# Patient Record
Sex: Male | Born: 1945 | Race: Black or African American | Hispanic: Refuse to answer | Marital: Married
Health system: Southern US, Community
[De-identification: ages and names within clinical notes are randomized; demographics above are authoritative.]

---

## 2001-06-05 ENCOUNTER — Encounter: Payer: Self-pay | Admitting: Family Medicine

## 2001-06-05 ENCOUNTER — Encounter: Admission: RE | Admit: 2001-06-05 | Discharge: 2001-06-05 | Payer: Self-pay | Admitting: Family Medicine

## 2008-05-18 ENCOUNTER — Inpatient Hospital Stay (HOSPITAL_COMMUNITY): Admission: RE | Admit: 2008-05-18 | Discharge: 2008-05-21 | Payer: Self-pay | Admitting: Orthopaedic Surgery

## 2009-09-16 IMAGING — CR DG CHEST 2V
2 series · 2 of 2 positions shown · non-contrast
Comparison: None available

CLINICAL DATA: Right knee osteoarthritis.  Preop for total knee
arthroplasty.

CHEST - 2 VIEW

[view not recorded (1 of 2)]
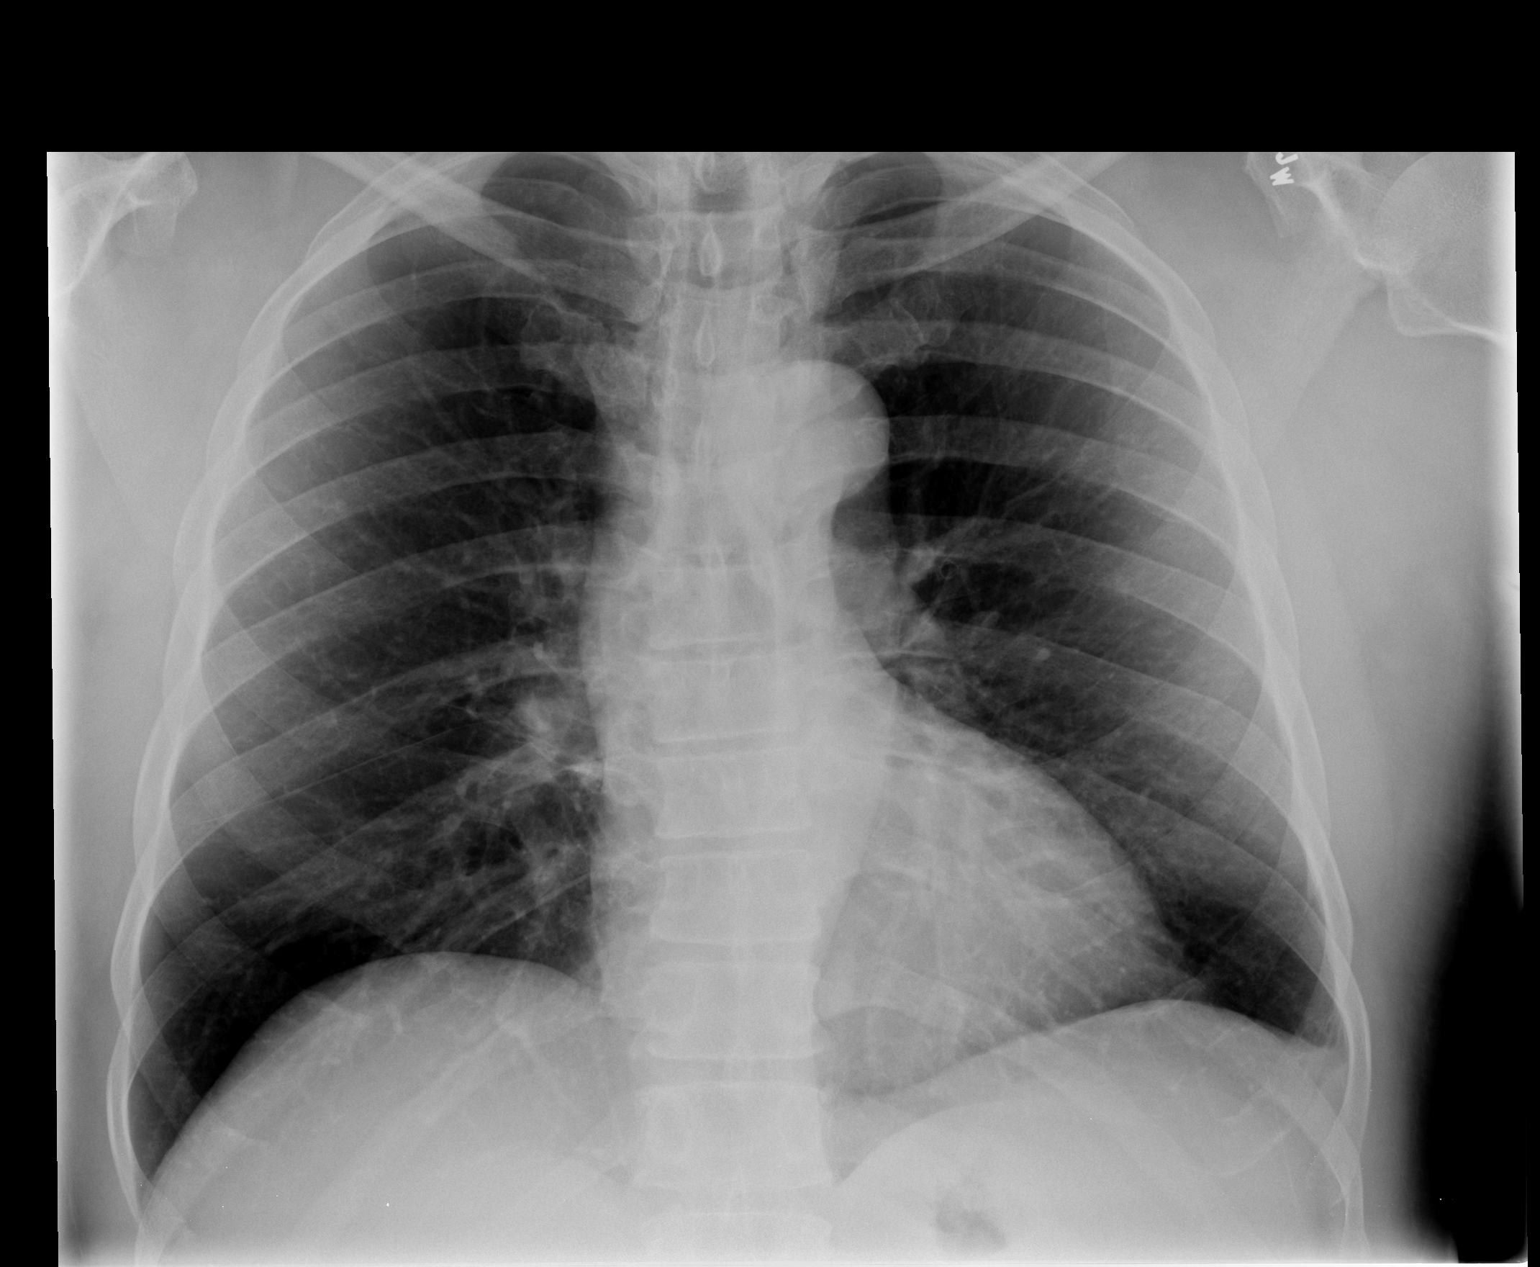

[view not recorded (2 of 2)]
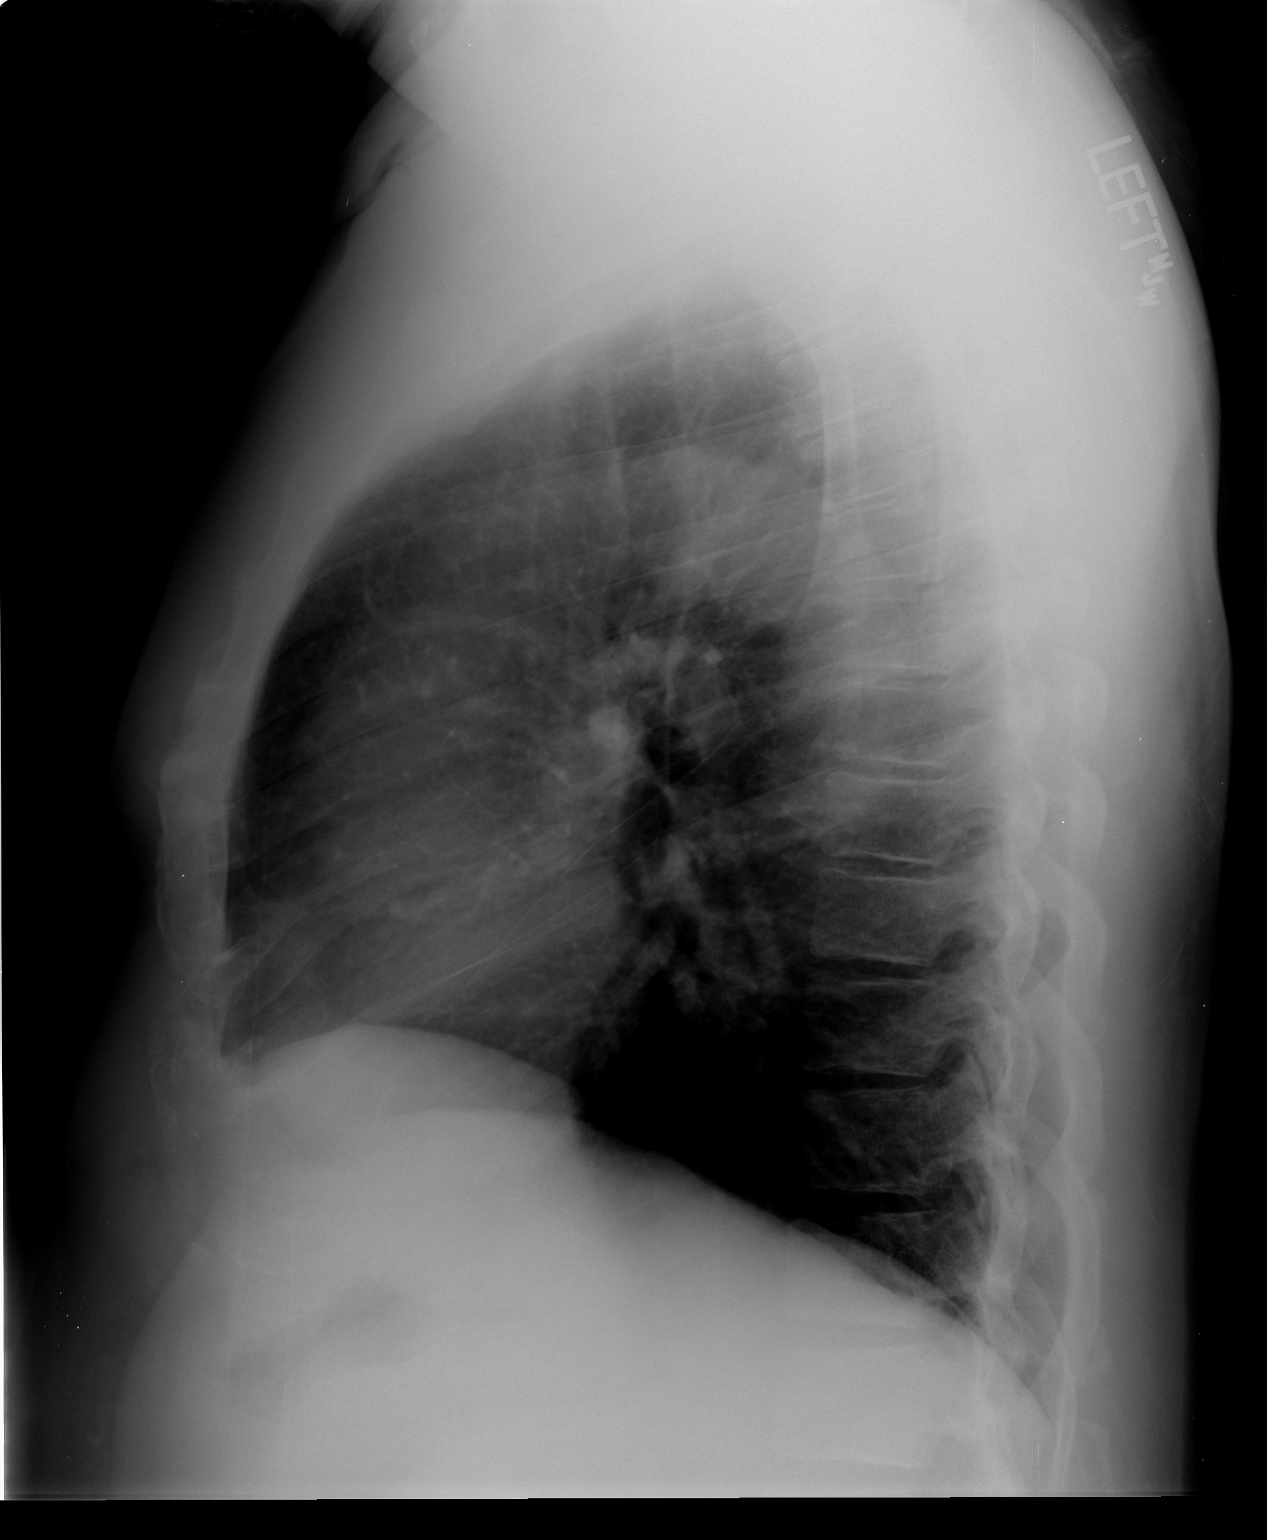

[2 of 2 positions shown; findings below may reference images not displayed]

FINDINGS: Heart size is within normal limits.  Both lungs are
clear.  Mild ectasia of the thoracic aorta is noted.  There is no
evidence of pleural effusion.  No mass or adenopathy identified.
IMPRESSION: No active cardiopulmonary disease.

## 2010-07-12 ENCOUNTER — Encounter: Payer: Self-pay | Admitting: *Deleted

## 2010-08-14 LAB — BASIC METABOLIC PANEL
BUN: 12 mg/dL (ref 6–23)
BUN: 10 mg/dL (ref 6–23)
CO2: 28 mEq/L (ref 19–32)
CO2: 26 mEq/L (ref 19–32)
CO2: 30 mEq/L (ref 19–32)
CO2: 30 mEq/L (ref 19–32)
Calcium: 10 mg/dL (ref 8.4–10.5)
Calcium: 8.7 mg/dL (ref 8.4–10.5)
Chloride: 106 mEq/L (ref 96–112)
Chloride: 95 mEq/L — ABNORMAL LOW (ref 96–112)
Chloride: 97 mEq/L (ref 96–112)
Chloride: 99 mEq/L (ref 96–112)
Creatinine, Ser: 0.87 mg/dL (ref 0.4–1.5)
Creatinine, Ser: 0.97 mg/dL (ref 0.4–1.5)
GFR calc Af Amer: >60 mL/min (ref >60)
GFR calc Af Amer: >60 mL/min (ref >60)
GFR calc Af Amer: >60 mL/min (ref >60)
GFR calc non Af Amer: >60 mL/min (ref >60)
GFR calc non Af Amer: >60 mL/min (ref >60)
GFR calc non Af Amer: >60 mL/min (ref >60)
Glucose, Bld: 79 mg/dL (ref 70–99)
Glucose, Bld: 134 mg/dL — ABNORMAL HIGH (ref 70–99)
Glucose, Bld: 139 mg/dL — ABNORMAL HIGH (ref 70–99)
Potassium: 4.4 mEq/L (ref 3.5–5.1)
Potassium: 3.2 mEq/L — ABNORMAL LOW (ref 3.5–5.1)
Potassium: 3.5 mEq/L (ref 3.5–5.1)
Potassium: 3.6 mEq/L (ref 3.5–5.1)
Sodium: 142 mEq/L (ref 135–145)
Sodium: 134 mEq/L — ABNORMAL LOW (ref 135–145)
Sodium: 135 mEq/L (ref 135–145)
Sodium: 136 mEq/L (ref 135–145)

## 2010-08-14 LAB — CBC
HCT: 46.7 % (ref 39.0–52.0)
HCT: 38.3 % — ABNORMAL LOW (ref 39.0–52.0)
HCT: 38.4 % — ABNORMAL LOW (ref 39.0–52.0)
HCT: 38.5 % — ABNORMAL LOW (ref 39.0–52.0)
Hemoglobin: 15.3 g/dL (ref 13.0–17.0)
Hemoglobin: 12.6 g/dL — ABNORMAL LOW (ref 13.0–17.0)
Hemoglobin: 12.7 g/dL — ABNORMAL LOW (ref 13.0–17.0)
Hemoglobin: 12.8 g/dL — ABNORMAL LOW (ref 13.0–17.0)
MCHC: 32.8 g/dL (ref 30.0–36.0)
MCHC: 32.6 g/dL (ref 30.0–36.0)
MCV: 82.1 fL (ref 78.0–100.0)
MCV: 82.3 fL (ref 78.0–100.0)
MCV: 82.8 fL (ref 78.0–100.0)
Platelets: 332 10*3/uL (ref 150–400)
RBC: 5.69 MIL/uL (ref 4.22–5.81)
RBC: 4.65 MIL/uL (ref 4.22–5.81)
RBC: 4.72 MIL/uL (ref 4.22–5.81)
RDW: 13.7 % (ref 11.5–15.5)
RDW: 13.5 % (ref 11.5–15.5)
WBC: 7.7 10*3/uL (ref 4.0–10.5)
WBC: 10.8 10*3/uL — ABNORMAL HIGH (ref 4.0–10.5)

## 2010-08-14 LAB — PROTIME-INR
INR: 1.1 (ref 0.00–1.49)
INR: 1.3 (ref 0.00–1.49)
Prothrombin Time: 14.4 seconds (ref 11.6–15.2)
Prothrombin Time: 23.4 seconds — ABNORMAL HIGH (ref 11.6–15.2)

## 2010-09-12 NOTE — Op Note (Signed)
NAME:  Mark Brandt, Mark Brandt NO.:  [id number]   MEDICAL RECORD NO.:  [id number]          PATIENT TYPE:  INP   LOCATION:  5041                         FACILITY:  [facility]   PHYSICIAN:  Lubertha Basque. Dalldorf, M.D.DATE OF BIRTH:  1945-07-27   DATE OF PROCEDURE:  05/18/2008  DATE OF DISCHARGE:                               OPERATIVE REPORT   PREOPERATIVE DIAGNOSIS:  Right knee degenerative joint disease.   POSTOPERATIVE DIAGNOSIS:  Right knee degenerative joint disease.   PROCEDURE:  Right total knee replacement.   ANESTHESIA:  General and block.   ATTENDING SURGEON:  Lubertha Basque. Jerl Santos, MD   ASSISTANT:  Lindwood Qua, PA   INDICATIONS FOR PROCEDURE:  The patient is a 65 year old man with a long  history of painful right knee.  He is status post an open meniscectomy  many years ago.  He has persisted with pain over recent years despite  oral anti-inflammatories and injections and braces.  He has bone-on-bone  degeneration seen on x-rays.  He has pain which limits his ability to  rest and walk and he is offered knee replacement.  Informed operative  consent was obtained after discussion of possible complications  including reaction to anesthesia, infection, DVT, PE, and death.   SUMMARY, FINDINGS, AND PROCEDURE:  Under general anesthesia and block  through a standard longitudinal anterior incision, a right knee  replacement was performed.  He had advanced degenerative change, medial  patellofemoral and a mild varus deformity.  He had excellent bone  quality.  We addressed this problem with a cemented DePuy LCS system  using a large plus femur, 6 tibia MVT with a short stem to address his  stature, 12.5 deep dish spacer, an 41-mm all polyethylene patella.  I  did include Zinacef antibiotic in the cement.  Lindwood Qua assisted  throughout and was invaluable to the completion of the case in that he  helped to position and retract while I performed the procedure.  [place]  was closed primarily and admitted to Orthopedic Surgery Service.   DESCRIPTION OF PROCEDURE:  The patient was taken to the operating suite  where general anesthetic was applied without difficulty.  He was also  given a block in the preanesthesia area.  He was positioned supine and  prepped and draped in the normal sterile fashion.  After the  administration of IV Kefzol, the right leg was elevated, exsanguinated,  tourniquet inflated about the thigh.  A longitudinal anterior incision  was made with dissection down the extensor mechanism.  All appropriate  anti-infection  measures were used including closed hooded exhaust  systems , rechanging by the surgical team, Betadine-impregnated drape,  and the preoperative IV antibiotic.  A medial parapatellar incision was  made.  The kneecap was flipped and the knee was flexed.  Some residual  meniscal tissues were removed along with the ACL and PCL.  An  intramedullary guide was placed in the tibia to make a roughly flat cut.  We then placed another intramedullary guide in the femur to make  anterior-posterior cuts creating a flexion gap of 12.5 mm.  I performed  a soft tissue release off the medial tibia and removed some osteophytes  to address his varus deformity.  A second intramedullary guide was then  placed in the femur to make a distal cut creating an equal extension gap  of 12.5 mm balancing the knee.  The femur sized to a large plus and the  tibia to a 6 and the appropriate guides were placed and utilized.  The  patella was cut down to thickness by 15 mm to size 17.  This sized to a  41 and the appropriate guides were placed and utilized.  Trial  components were removed followed by pulsatile lavage and irrigation of  all 3 surfaces.  Cement was mixed including Zinacef and was pressurized  onto the bones.  The aforementioned DePuy LCS components were placed.  Excess cement was trimmed and pressure was held on the components until  cement  hardened.  The tourniquet was deflated and a small amount of  bleeding was easily controlled with some pressure and irrigation.  Again, the knee was irrigated.  The knee easily went to full extension  with slight hyperextension and flexed well.  Drain was placed exiting  superolaterally.  The extensor mechanism was reapproximated with #1  Vicryl in an interrupted fashion followed by subcutaneous  reapproximation with 0 and 2-0 undyed Vicryl.  Skin was closed with  staples.  Adaptic was applied followed by dry gauze and an Ace wrap.  Estimated blood loss and intraoperative fluids as well as accurate  tourniquet time could be obtained from anesthesia records.   DISPOSITION:  The patient was extubated in the operating room and taken  to the recovery room in stable addition.  He is to be admitted to the  Orthopedic Surgery Service for appropriate postop care to include  perioperative antibiotics and Coumadin plus Lovenox for DVT prophylaxis.      Lubertha Basque Jerl Santos, M.D.  Electronically Signed     PGD/MEDQ  D:  05/18/2008  T:  05/19/2008  Job:  [id number]

## 2010-09-15 NOTE — Discharge Summary (Signed)
NAME:  [name], [name] NO.:  [id number]   MEDICAL RECORD NO.:  [id number]          PATIENT TYPE:  INP   LOCATION:  5041                         FACILITY:  [facility]   PHYSICIAN:  Lubertha Basque. Dalldorf, M.D.DATE OF BIRTH:  [birth year]   DATE OF ADMISSION:  05/18/2008  DATE OF DISCHARGE:  05/21/2008                               DISCHARGE SUMMARY   ADMITTING DIAGNOSES:  1. Right knee end-stage degenerative joint disease.  2. Hypertension.   DISCHARGE DIAGNOSES:  1. Right knee end-stage degenerative joint disease.  2. Hypertension.   OPERATION:  Right total knee replacement.   BRIEF HISTORY:  This is a 65 year old black male patient, well known to  our practice, who is having increasing right knee pain with every step  and trouble sleeping at nighttime.  His x-rays reveal end-stage bone-on-  bone severe DJD.  He has failed anti-inflammatory medicines and  corticosteroid injections.  We have discussed treatment options with him  that being total knee replacement.   PERTINENT LABORATORY AND X-RAY FINDINGS:  He has got sinus rhythm with  first-degree AV block on his EKG.  WBCs 10.2, RBCs 4.65, hemoglobin  12.7, and platelets 272.  Serial INRs were done as he was on low-dose  Coumadin protocol.  Potassium 3.2, sodium 134, glucose 139, BUN 5, and  creatinine 0.85.   COURSE IN THE HOSPITAL:  He was admitted postoperatively.  He was put on  a PCA Dilaudid pump full dose, IV Ancef a gram q.8 h x3 doses, DVT  prophylaxis protocol per pharmacy with Coumadin, and Lovenox.  He was  given Colace, Senokot, antiemetics as needed, oral pain medications,  incentive spirometry, knee-high TEDs, and then a CPM machine 0-50,  advance as tolerated.  Physical therapy for out of bed and weightbearing  as tolerated.  Follow up lab and studies as mentioned above.  The first  day, postop, blood pressure 133/81 and temperature 97.  Lungs, clear.  Abdomen, soft.  Right knee with no sign of  infection.  He was voiding  well, walking with brace and weightbearing as tolerated with help of  physical therapy.  The second day, postop, his hemoglobin was 12.6 and  INR 2.0.  He had a BM, was voiding well, and home equipment was arranged  and home therapy was arranged and he was discharged home.   CONDITION ON DISCHARGE:  Improved.   FOLLOWUP:  He will remain on his nifedipine per home dose.  Coumadin x2  weeks, dose regulated by pharmacy.  He is on 7.5 mg of Coumadin per day,  a 5 mg pill of 1-1/2 pills per day and his INR be checked in 2 days.  A  prescription for Percocet 1-2 every 4-6 hours for pain, Robaxin 750 one  q.8 for spasm.  [facility] to  draw protimes and provide physical therapy and keep dressing clean and  dry and change daily.  Low-sodium, heart-healthy diet.  Weightbearing as  tolerated.  Call for an appointment in 7-10 days.  Any sign of  infection, also to call the office at [phone].  Mark Brandt, P.A.      Lubertha Basque Jerl Santos, M.D.  Electronically Signed    MC/MEDQ  D:  06/22/2008  T:  06/23/2008  Job:  [id number]

## 2010-12-30 ENCOUNTER — Encounter: Payer: Self-pay | Admitting: *Deleted

## 2019-05-26 ENCOUNTER — Ambulatory Visit: Payer: Self-pay

## 2019-06-04 ENCOUNTER — Ambulatory Visit: Payer: Self-pay | Attending: Internal Medicine

## 2019-06-04 DIAGNOSIS — Z23 Encounter for immunization: Secondary | ICD-10-CM | POA: Insufficient documentation

## 2019-06-04 NOTE — Progress Notes (Signed)
   Covid-19 Vaccination Clinic  Name:  Tavita Eastham    MRN: [id number] DOB: 10-26-1945  06/04/2019  Mr. Dubie was observed post Covid-19 immunization for 15 minutes without incidence. He was provided with Vaccine Information Sheet and instruction to access the V-Safe system.   Mr. Haslam was instructed to call 911 with any severe reactions post vaccine: Marland Kitchen Difficulty breathing  . Swelling of your face and throat  . A fast heartbeat  . A bad rash all over your body  . Dizziness and weakness    Immunizations Administered    Name Date Dose VIS Date Route   Pfizer COVID-19 Vaccine 06/04/2019  3:10 PM 0.3 mL 04/10/2019 Intramuscular   Manufacturer: [employer], [employer]   Lot: [id number]   NDC: [address]-7

## 2019-06-06 ENCOUNTER — Ambulatory Visit: Payer: Self-pay

## 2019-06-29 ENCOUNTER — Ambulatory Visit: Payer: Self-pay | Attending: Internal Medicine

## 2019-06-29 DIAGNOSIS — Z23 Encounter for immunization: Secondary | ICD-10-CM | POA: Insufficient documentation

## 2019-06-29 NOTE — Progress Notes (Signed)
   Covid-19 Vaccination Clinic  Name:  Eean Buss    MRN: [id number] DOB: 1945-05-09  06/29/2019  Mr. Sharma was observed post Covid-19 immunization for 15 minutes without incidence. He was provided with Vaccine Information Sheet and instruction to access the V-Safe system.   Mr. Mcphee was instructed to call 911 with any severe reactions post vaccine: Marland Kitchen Difficulty breathing  . Swelling of your face and throat  . A fast heartbeat  . A bad rash all over your body  . Dizziness and weakness    Immunizations Administered    Name Date Dose VIS Date Route   Pfizer COVID-19 Vaccine 06/29/2019  4:50 PM 0.3 mL 04/10/2019 Intramuscular   Manufacturer: [employer], [employer]   Lot: [id number]   NDC: [address]-6
